# Patient Record
Sex: Female | Born: 2000 | Race: Black or African American | Hispanic: No | Marital: Single | Smoking: Never smoker
Health system: Southern US, Community
[De-identification: ages and names within clinical notes are randomized; demographics above are authoritative.]

## PROBLEM LIST (undated history)

## (undated) DIAGNOSIS — J45909 Unspecified asthma, uncomplicated: Secondary | ICD-10-CM

## (undated) HISTORY — PX: KNEE SURGERY: SHX244

---
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
## 2021-01-04 ENCOUNTER — Encounter (HOSPITAL_COMMUNITY): Payer: Self-pay

## 2021-01-04 ENCOUNTER — Ambulatory Visit (HOSPITAL_COMMUNITY)
Admission: EM | Admit: 2021-01-04 | Discharge: 2021-01-04 | Disposition: A | Discharge status: Home / Self Care | Payer: BC Managed Care – PPO | Attending: Emergency Medicine | Admitting: Emergency Medicine

## 2021-01-04 ENCOUNTER — Other Ambulatory Visit: Payer: Self-pay

## 2021-01-04 DIAGNOSIS — J45909 Unspecified asthma, uncomplicated: Secondary | ICD-10-CM | POA: Diagnosis not present

## 2021-01-04 DIAGNOSIS — H66001 Acute suppurative otitis media without spontaneous rupture of ear drum, right ear: Secondary | ICD-10-CM | POA: Diagnosis present

## 2021-01-04 DIAGNOSIS — J069 Acute upper respiratory infection, unspecified: Secondary | ICD-10-CM | POA: Diagnosis not present

## 2021-01-04 LAB — POCT RAPID STREP A, ED / UC: Streptococcus, Group A Screen (Direct): NEGATIVE

## 2021-01-04 LAB — POC INFLUENZA A AND B ANTIGEN (URGENT CARE ONLY)
INFLUENZA A ANTIGEN, POC: NEGATIVE
INFLUENZA B ANTIGEN, POC: NEGATIVE

## 2021-01-04 MED ORDER — BUDESONIDE-FORMOTEROL FUMARATE 80-4.5 MCG/ACT IN AERO: 2.0000 | INHALATION_SPRAY | Freq: Two times a day (BID) | RESPIRATORY_TRACT | 2 refills | Status: AC

## 2021-01-04 MED ORDER — ALBUTEROL SULFATE HFA 108 (90 BASE) MCG/ACT IN AERS: 2.0000 | INHALATION_SPRAY | Freq: Four times a day (QID) | RESPIRATORY_TRACT | 2 refills | Status: AC | PRN

## 2021-01-04 MED ORDER — AEROCHAMBER PLUS FLO-VU MEDIUM MISC: 1.0000 | Freq: Once | 0 refills | Status: AC

## 2021-01-04 MED ORDER — CEFDINIR 300 MG PO CAPS: 300.0000 mg | ORAL_CAPSULE | Freq: Two times a day (BID) | ORAL | 0 refills | Status: AC

## 2021-01-04 NOTE — ED Triage Notes (Signed)
Pt presents with a cough and sore throat. States she has a stuffy nose. States she has asthma and has had a flare up. States she took Mucinex.

## 2021-01-04 NOTE — Discharge Instructions (Addendum)
For the ear infection in your right ear, have given you prescription for cefdinir, please take 1 tablet twice daily for the next 7 days.  I have renewed your prescription for albuterol and provided you with a spacer.  Would also like for you to begin daily maintenance inhaler called Symbicort, 2 puffs twice daily for the next 30 days.  You will be notified of the results of your flu and strep tests once they are complete.  As soon as possible, please follow-up with your primary care physician to discuss your asthma and strategies to prevent flareups because of respiratory infections are inevitable.  Thank you for visiting urgent care today, I hope you feel better soon.

## 2021-01-04 NOTE — ED Provider Notes (Signed)
[facility]    CSN: [id number] Arrival date & time: 01/04/21  1833      History   Chief Complaint Chief Complaint  Patient presents with   Cough   Sore Throat    HPI Kelly Duke is a 20 y.o. female.   Pt presents with a cough and sore throat. States she has a stuffy nose.  Patient states she is not sure whether or not she is had a fever.  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 States she took Mucinex and has been using Vicks vapor rub with little relief.  Patient's vital signs are normal on arrival today she is saturating 100%.  Patient is also nontoxic in appearance, smiling, pleasant and interactive.  The history is provided by the patient.   Past Medical History:  Diagnosis Date   Asthma     There are no problems to display for this patient.   Past Surgical History:  Procedure Laterality Date   KNEE SURGERY Left     OB History   No obstetric history on file.      Home Medications    Prior to Admission medications   Medication Sig Start Date End Date Taking? Authorizing Provider  albuterol (VENTOLIN HFA) 108 (90 Base) MCG/ACT inhaler Inhale 2 puffs into the lungs every 6 (six) hours as needed for wheezing or shortness of breath (Cough). 01/04/21  Yes Theadora Rama Scales, PA-C  budesonide-formoterol (SYMBICORT) 80-4.5 MCG/ACT inhaler Inhale 2 puffs into the lungs 2 (two) times daily. 01/04/21 02/03/21 Yes Theadora Rama Scales, PA-C  cefdinir (OMNICEF) 300 MG capsule Take 1 capsule (300 mg total) by mouth 2 (two) times daily for 7 days. 01/04/21 01/11/21 Yes Theadora Rama Scales, PA-C  Spacer/Aero-Holding Chambers (AEROCHAMBER PLUS FLO-VU MEDIUM) MISC 1 each by Other route once for 1 dose. 01/04/21 01/04/21 Yes Theadora Rama Scales, PA-C    Family  History Family History  Problem Relation Age of Onset   Heart disease Father    Hyperlipidemia Father    Cancer Maternal Grandmother    Hypertension Maternal Grandmother    Cancer Maternal Grandfather    Diabetes Maternal Grandfather    Cancer Paternal Grandmother    Hyperlipidemia Paternal Grandmother     Social History Social History   Tobacco Use   Smoking status: Never   Smokeless tobacco: Never     Allergies   Neosporin [neomycin-bacitracin zn-polymyx]   Review of Systems Review of Systems Pertinent findings noted in history of present illness.    Physical Exam Triage Vital Signs ED Triage Vitals  Enc Vitals Group     BP 12/29/20 0827 (!) 147/82     Pulse Rate 12/29/20 0827 72     Resp 12/29/20 0827 18     Temp 12/29/20 0827 98.3 F (36.8 C)     Temp Source 12/29/20 0827 Oral     SpO2 12/29/20 0827 98 %     Weight --      Height --      Head Circumference --      Peak Flow --      Pain Score 12/29/20 0826 5     Pain Loc --      Pain Edu? --      Excl. in GC? --    No data found.  Updated Vital Signs BP 113/72 (BP Location: Right Arm)   Pulse 78   Temp 98.9 F (37.2 C) (Oral)   Resp 20   SpO2 100%   Visual Acuity Right Eye Distance:   Left Eye Distance:   Bilateral Distance:    Right Eye Near:   Left Eye Near:    Bilateral Near:     Physical Exam Vitals and nursing note reviewed.  Constitutional:      General: She is not in acute distress.    Appearance: Normal appearance. She is not ill-appearing.  HENT:     Head: Normocephalic and atraumatic.     Salivary Glands: Right salivary gland is diffusely enlarged and tender. Left salivary gland is diffusely enlarged and tender.     Right Ear: Ear canal and external ear normal. No drainage. A middle ear effusion is present. There is no impacted cerumen. Tympanic membrane is erythematous and bulging.     Left Ear: Tympanic membrane, ear canal and external ear normal. No drainage.  No middle  ear effusion. There is no impacted cerumen. Tympanic membrane is not erythematous or bulging.     Nose: No nasal deformity, septal deviation, mucosal edema, congestion or rhinorrhea.     Right Turbinates: Enlarged, swollen and pale.     Left Turbinates: Enlarged, swollen and pale.     Right Sinus: No maxillary sinus tenderness or frontal sinus tenderness.     Left Sinus: No maxillary sinus tenderness or frontal sinus tenderness.     Mouth/Throat:     Lips: Pink. No lesions.     Mouth: Mucous membranes are moist. No oral lesions.     Pharynx: Uvula midline. Pharyngeal swelling, posterior oropharyngeal erythema and uvula swelling present.     Tonsils: Tonsillar exudate present. 3+ on the right. 3+ on the left.  Eyes:     General: Lids are normal.        Right eye: No discharge.        Left eye: No discharge.     Extraocular Movements: Extraocular movements intact.     Conjunctiva/sclera: Conjunctivae normal.     Right eye: Right conjunctiva is not injected.     Left eye: Left conjunctiva is not injected.  Neck:     Trachea: Trachea and phonation normal.  Cardiovascular:     Rate and Rhythm: Normal rate and regular rhythm.     Pulses: Normal pulses.     Heart sounds: Normal heart sounds. No murmur heard.   No friction rub. No gallop.  Pulmonary:     Effort: Pulmonary effort is normal. No accessory muscle usage, prolonged expiration or respiratory distress.     Breath sounds: Normal breath sounds. No stridor, decreased air movement or transmitted upper airway sounds. No decreased breath sounds, wheezing, rhonchi or rales.  Chest:     Chest wall: No tenderness.  Musculoskeletal:        General: Normal range of motion.     Cervical back: Normal range of motion and neck supple. Normal range of motion.  Lymphadenopathy:     Cervical: No cervical adenopathy.  Skin:    General: Skin is warm and dry.     Findings: No erythema or rash.  Neurological:     General: No focal deficit present.      Mental Status: She is alert and oriented to person, place, and time.  Psychiatric:        Mood and Affect: Mood normal.  Behavior: Behavior normal.     UC Treatments / Results  Labs (all labs ordered are listed, but only abnormal results are displayed) Labs Reviewed  CULTURE, GROUP A STREP [facility])  POCT RAPID STREP A, ED / UC  POC INFLUENZA A AND B ANTIGEN (URGENT CARE ONLY)    EKG   Radiology No results found.  Procedures Procedures (including critical care time)  Medications Ordered in UC Medications - No data to display  Initial Impression / Assessment and Plan / UC Course  I have reviewed the triage vital signs and the nursing notes.  Pertinent labs & imaging results that were available during my care of the patient were reviewed by me and considered in my medical decision making (see chart for details).     Physical exam findings are concerning for enlarged tonsils with exudate and erythema, suppurative effusion behind right TM with erythema.  I will treat patient with cefdinir for acute otitis media, rapid flu and strep test were performed today, throat culture will be sent per protocol.  Patient verbalized understanding and agreement of plan as discussed.  All questions were addressed during visit.  Please see discharge instructions below for further details of plan.  Final Clinical Impressions(s) / UC Diagnoses   Final diagnoses:  Upper respiratory tract infection, unspecified type  Acute suppurative otitis media of right ear  Asthma, unspecified asthma severity, unspecified whether complicated, unspecified whether persistent     Discharge Instructions      For the ear infection in your right ear, have given you prescription for cefdinir, please take 1 tablet twice daily for the next 7 days.  I have renewed your prescription for albuterol and provided you with a spacer.  Would also like for you to begin daily maintenance inhaler called Symbicort, 2  puffs twice daily for the next 30 days.  You will be notified of the results of your flu and strep tests once they are complete.  As soon as possible, please follow-up with your primary care physician to discuss your asthma and strategies to prevent flareups because of respiratory infections are inevitable.  Thank you for visiting urgent care today, I hope you feel better soon.     ED Prescriptions     Medication Sig Dispense Auth. Provider   budesonide-formoterol (SYMBICORT) 80-4.5 MCG/ACT inhaler Inhale 2 puffs into the lungs 2 (two) times daily. 1 each Theadora Rama Scales, PA-C   albuterol (VENTOLIN HFA) 108 (90 Base) MCG/ACT inhaler Inhale 2 puffs into the lungs every 6 (six) hours as needed for wheezing or shortness of breath (Cough). 18 g Theadora Rama Scales, PA-C   Spacer/Aero-Holding Chambers (AEROCHAMBER PLUS FLO-VU MEDIUM) MISC 1 each by Other route once for 1 dose. 1 each Theadora Rama Scales, PA-C   cefdinir (OMNICEF) 300 MG capsule Take 1 capsule (300 mg total) by mouth 2 (two) times daily for 7 days. 14 capsule Theadora Rama Scales, PA-C      PDMP not reviewed this encounter.    Theadora Rama Scales, PA-C 01/04/21 2057

## 2021-01-07 LAB — CULTURE, GROUP A STREP (THRC)

## 2021-06-05 DIAGNOSIS — A549 Gonococcal infection, unspecified: Secondary | ICD-10-CM | POA: Insufficient documentation

## 2021-08-06 DIAGNOSIS — A749 Chlamydial infection, unspecified: Secondary | ICD-10-CM | POA: Insufficient documentation

## 2023-06-10 ENCOUNTER — Encounter: Payer: Self-pay | Admitting: Dermatology

## 2023-06-10 ENCOUNTER — Ambulatory Visit: Payer: BC Managed Care – PPO | Admitting: Dermatology

## 2023-06-10 VITALS — BP 125/82 | HR 98

## 2023-06-10 DIAGNOSIS — L810 Postinflammatory hyperpigmentation: Secondary | ICD-10-CM

## 2023-06-10 DIAGNOSIS — L700 Acne vulgaris: Secondary | ICD-10-CM | POA: Diagnosis not present

## 2023-06-10 DIAGNOSIS — L709 Acne, unspecified: Secondary | ICD-10-CM

## 2023-06-10 MED ORDER — ALTRENO 0.05 % EX LOTN: 1.0000 | TOPICAL_LOTION | Freq: Every evening | CUTANEOUS | 5 refills | Status: AC

## 2023-06-10 MED ORDER — CLINDAMYCIN PHOSPHATE 1 % EX SWAB: 1.0000 | Freq: Every morning | CUTANEOUS | 6 refills | Status: AC

## 2023-06-10 NOTE — Patient Instructions (Addendum)
 Hello Joeli,  Thank you for visiting today. Here is a summary of the key instructions:  - Medications:   - Use clindamycin swabs in the morning   - Apply Altreno (tretinoin) at night:     - Start with once a week for the first week     - Advance to twice a week for 2-3 weeks     - Maximum use: 3 nights a week   - If irritation occurs, stop Altreno for a week, then restart less frequently  - Skincare Routine:   Morning:   - Wash face with La Roche-Posay salicylic acid cleanser   - Apply clindamycin swabs   - Apply Vichy hyaluronic acid Serum (photo below)   - Apply moisturizer with sunscreen    Night:   - Wash face with La Roche-Posay Gentle Cleanser   - Apply Vichy hyaluronic acid   - Apply a pea-sized amount of Altreno (on designated nights)   - Apply Eucerin Radiant Tone Serum (purchase at [employer])  - Products:   - Use Vichy hyaluronic acid as a base before Altreno   - Consider using Eucerin Radiant Tone with thiametol as a moisturizer for dark spots   - Use CeraVe daily moisturizer lotion during the day   - Switch to CeraVe cream at night if skin feels dry  - Instructions:   - If dryness or stinging occurs, stop using salicylic acid cleanser   - Wear moisturizer with sunscreen in the mornings to prevent dark spots from darkening   - Sign up for [employer] to receive Altreno (expected cost: $47-$52)  - Follow-up: Return for a follow-up appointment in 4 months  Please reach out if you have any questions or concerns.  Warm regards,  Dr. Langston Reusing, Dermatology           Important Information   Due to recent changes in healthcare laws, you may see results of your pathology and/or laboratory studies on MyChart before the doctors have had a chance to review them. We understand that in some cases there may be results that are confusing or concerning to you. Please understand that not all results are received at the same time and often the doctors may need to  interpret multiple results in order to provide you with the best plan of care or course of treatment. Therefore, we ask that you please give [place] 2 business days to thoroughly review all your results before contacting the office for clarification. Should we see a critical lab result, you will be contacted sooner.     If You Need Anything After Your Visit   If you have any questions or concerns for your doctor, please call our main line at [phone]. If no one answers, please leave a voicemail as directed and we will return your call as soon as possible. Messages left after 4 pm will be answered the following business day.    You may also send [place] a message via MyChart. We typically respond to MyChart messages within 1-2 business days.  For prescription refills, please ask your pharmacy to contact our office. Our fax number is [phone].  If you have an urgent issue when the clinic is closed that cannot wait until the next business day, you can page your doctor at the number below.     Please note that while we do our best to be available for urgent issues outside of office hours, we are not available 24/7.    If you  have an urgent issue and are unable to reach [place], you may choose to seek medical care at your doctor's office, retail clinic, urgent care center, or emergency room.   If you have a medical emergency, please immediately call 911 or go to the emergency department. In the event of inclement weather, please call our main line at [phone] for an update on the status of any delays or closures.  Dermatology Medication Tips: Please keep the boxes that topical medications come in in order to help keep track of the instructions about where and how to use these. Pharmacies typically print the medication instructions only on the boxes and not directly on the medication tubes.   If your medication is too expensive, please contact our office at [phone] or send [place] a message through  MyChart.    We are unable to tell what your co-pay for medications will be in advance as this is different depending on your insurance coverage. However, we may be able to find a substitute medication at lower cost or fill out paperwork to get insurance to cover a needed medication.    If a prior authorization is required to get your medication covered by your insurance company, please allow [place] 1-2 business days to complete this process.   Drug prices often vary depending on where the prescription is filled and some pharmacies may offer cheaper prices.   The website www.goodrx.com contains coupons for medications through different pharmacies. The prices here do not account for what the cost may be with help from insurance (it may be cheaper with your insurance), but the website can give you the price if you did not use any insurance.  - You can print the associated coupon and take it with your prescription to the pharmacy.  - You may also stop by our office during regular business hours and pick up a GoodRx coupon card.  - If you need your prescription sent electronically to a different pharmacy, notify our office through [facility] or by phone at [phone]

## 2023-06-10 NOTE — Progress Notes (Signed)
   New Patient Visit   Subjective  Kelly Duke is a 23 y.o. female who presents for the following: Acne  Patient states she has acne located at the face that she would like to have examined. Patient reports the areas have been there for 12 years. She reports the areas are bothersome. Patient reports her acne can be painful. Patient rates irritation 3 out of 10. She states that the areas have spread. Patient reports she has previously been treated for these areas. Patient reports her facial regimen consist of washing 2 times daily with LRP SA Wash (Double cleansing at night), She follows uo with Vitamin C Serum 9Vit C, Hyluronic Acid and Vit E), 2 times daily she will spot treat with LRP Effaclar BPO treatment and Cerave Daily Moisturizing Cream and LRP Anthelios Suncreen. She has also completed laser treatment last treatment was in February. Patient reports she has tried and failed Tretinoin 0.025% and it caused excessive burning and peeling. She states she only used it every other night.  The following portions of the chart were reviewed this encounter and updated as appropriate: medications, allergies, medical history  Review of Systems:  No other skin or systemic complaints except as noted in HPI or Assessment and Plan.  Objective  Well appearing patient in no apparent distress; mood and affect are within normal limits.  A focused examination was performed of the following areas: Face  Relevant exam findings are noted in the Assessment and Plan.            Assessment & Plan   ACNE VULGARIS and PIH Exam: Open comedones and inflammatory papules w/ secondary brown macules  Flared  - Assessment: Patient presents with persistent acne for approximately two years, with flares associated with menstrual cycle. Previous treatments include over-the-counter products and tretinoin (lowest strength), which caused skin irritation. Chronicity, hormonal component, and inadequate response to  OTC treatments warrant prescription management.  Treatment Plan: - We will prescribe Clindamycin swabs to apply directly to the face after washing - Recommend continuing LRP SA Wash as long as her face tolerates and isn't having excessive dryness. Advised if she is experiencing excessive dryness to D'c the LRP SA wash and switch to LRP Hydrating Facial wash - We will plan to prescribe Altreno 0.05% to apply 1 night for the first week, after 1 week increase to 3 nights weekly M,W, F - Recommended continuing Cerave Moisturizer, but recommended trying the lotion instead of the cream due to weather - Advised if she is still having deeper breakouts at the follow up visit in 4 months, we will discuss trying a oral medication to control  - We will plan to follow in 4 months to reassess  ACNE, UNSPECIFIED ACNE TYPE   Related Medications ALTRENO 0.05 % LOTN Apply 1 Application topically at bedtime. For the first week apply 1 night weekly, after first week apply 2 nights (Monday and Thursday) weekly, After 3rd week increase to 3 nights weekly (Monday, Wednesday and Friday) clindamycin (CLEOCIN T) 1 % SWAB Apply 1 Application topically in the morning. Swab face in the am after washing  Return in about 4 months (around 10/10/2023) for Acne F/U.  I, Jetta Ager, am acting as Neurosurgeon for [employer], DO.  Documentation: I have reviewed the above documentation for accuracy and completeness, and I agree with the above.  Louana Roup, DO

## 2023-06-17 ENCOUNTER — Encounter: Payer: Self-pay | Admitting: Dermatology

## 2023-06-20 ENCOUNTER — Encounter: Payer: Self-pay | Admitting: Dermatology

## 2023-08-30 ENCOUNTER — Emergency Department (HOSPITAL_COMMUNITY)
Admission: EM | Admit: 2023-08-30 | Discharge: 2023-08-31 | Discharge status: Against Medical Advice | Attending: Emergency Medicine | Admitting: Emergency Medicine

## 2023-08-30 ENCOUNTER — Encounter (HOSPITAL_COMMUNITY): Payer: Self-pay | Admitting: *Deleted

## 2023-08-30 ENCOUNTER — Other Ambulatory Visit: Payer: Self-pay

## 2023-08-30 DIAGNOSIS — S61411A Laceration without foreign body of right hand, initial encounter: Secondary | ICD-10-CM | POA: Diagnosis present

## 2023-08-30 DIAGNOSIS — S61214A Laceration without foreign body of right ring finger without damage to nail, initial encounter: Secondary | ICD-10-CM | POA: Diagnosis not present

## 2023-08-30 DIAGNOSIS — W25XXXA Contact with sharp glass, initial encounter: Secondary | ICD-10-CM | POA: Insufficient documentation

## 2023-08-30 DIAGNOSIS — S61412A Laceration without foreign body of left hand, initial encounter: Secondary | ICD-10-CM | POA: Diagnosis not present

## 2023-08-30 DIAGNOSIS — Z5321 Procedure and treatment not carried out due to patient leaving prior to being seen by health care provider: Secondary | ICD-10-CM | POA: Diagnosis not present

## 2023-08-30 NOTE — ED Triage Notes (Signed)
 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

## 2023-08-31 ENCOUNTER — Emergency Department (HOSPITAL_COMMUNITY)

## 2023-08-31 ENCOUNTER — Emergency Department (HOSPITAL_COMMUNITY)
Admission: EM | Admit: 2023-08-31 | Discharge: 2023-08-31 | Disposition: A | Discharge status: Home / Self Care | Payer: Worker's Compensation | Attending: Emergency Medicine | Admitting: Emergency Medicine

## 2023-08-31 ENCOUNTER — Encounter (HOSPITAL_COMMUNITY): Payer: Self-pay | Admitting: Emergency Medicine

## 2023-08-31 DIAGNOSIS — S61214A Laceration without foreign body of right ring finger without damage to nail, initial encounter: Secondary | ICD-10-CM | POA: Diagnosis present

## 2023-08-31 DIAGNOSIS — W25XXXA Contact with sharp glass, initial encounter: Secondary | ICD-10-CM | POA: Diagnosis not present

## 2023-08-31 DIAGNOSIS — Z23 Encounter for immunization: Secondary | ICD-10-CM | POA: Diagnosis not present

## 2023-08-31 DIAGNOSIS — S61212A Laceration without foreign body of right middle finger without damage to nail, initial encounter: Secondary | ICD-10-CM | POA: Diagnosis not present

## 2023-08-31 DIAGNOSIS — Y990 Civilian activity done for income or pay: Secondary | ICD-10-CM | POA: Insufficient documentation

## 2023-08-31 MED ORDER — LIDOCAINE-EPINEPHRINE (PF) 2 %-1:200000 IJ SOLN
INTRAMUSCULAR | Status: AC
  Filled 2023-08-31: qty 20

## 2023-08-31 MED ORDER — TETANUS-DIPHTH-ACELL PERTUSSIS 5-2.5-18.5 LF-MCG/0.5 IM SUSY
0.5000 mL | PREFILLED_SYRINGE | Freq: Once | INTRAMUSCULAR | Status: AC
  Administered 2023-08-31: 0.5 mL via INTRAMUSCULAR
  Filled 2023-08-31: qty 0.5

## 2023-08-31 NOTE — Discharge Instructions (Addendum)
 You are seen in the ER today for your lacerations.  The large laceration of the base of your ring finger was repaired with a single suture which will need to be removed at the urgent care/ER/your primary care doctor in 7 to 10 days.  Your other laceration on your middle finger was repaired with skin glue which will fall off on its own in a couple of weeks.  Please keep your skin clean and dry, place a clean dressing on it daily, you may use antibiotic ointment over-the-counter.  Return to the ER sooner if you develop any redness, swelling, puslike drainage from your wounds, or any other new severe symptoms.

## 2023-08-31 NOTE — ED Notes (Signed)
 Pt left AMA

## 2023-08-31 NOTE — ED Triage Notes (Signed)
 Pt cut R middle finger on glass at work. Palmar surface. Bleeding controlled. Not updated on tetanus.

## 2023-08-31 NOTE — ED Notes (Signed)
 Wrapped finger/hand with non adhesive dressing and wrap to cover lacerations.

## 2023-08-31 NOTE — ED Provider Notes (Signed)
 [place] [facility] Note   CSN: [id number] Arrival date & time: 08/31/23  0036     Patient presents with: Extremity Laceration   Kelly Duke is a 23 y.o. 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 It broke on the counter, and she caught the broken glass on the way to the floor causing a few small lacerations to the volar aspect of both hands. Unknown last tetanus.    HPI     Prior to Admission medications   Medication Sig Start Date End Date Taking? Authorizing Provider  albuterol  (VENTOLIN  HFA) 108 (90 Base) MCG/ACT inhaler Inhale 2 puffs into the lungs every 6 (six) hours as needed for wheezing or shortness of breath (Cough). 01/04/21   Joesph Shaver Scales, PA-C  ALTRENO  0.05 % LOTN Apply 1 Application topically at bedtime. For the first week apply 1 night weekly, after first week apply 2 nights (Monday and Thursday) weekly, After 3rd week increase to 3 nights weekly (Monday, Wednesday and Friday) 06/10/23   Alm Delon SAILOR, DO  budesonide -formoterol  (SYMBICORT ) 80-4.5 MCG/ACT inhaler Inhale 2 puffs into the lungs 2 (two) times daily. 01/04/21 06/10/23  Joesph Shaver Scales, PA-C  cetirizine-pseudoephedrine (ZYRTEC-D) 5-120 MG tablet     [provider]  clindamycin  (CLEOCIN  T) 1 % SWAB Apply 1 Application topically in the morning. Swab face in the am after washing 06/10/23   Alm Delon SAILOR, DO  hydrOXYzine (ATARAX) 25 MG tablet Take 25 mg by mouth at bedtime as needed.    [provider]  lamoTRIgine (LAMICTAL) 150 MG tablet TAKE 1/2 TABLET(S) BY MOUTH DAILY FOR MOOD STABILITY    [provider]  methylphenidate 54 MG PO CR tablet Take 54 mg by mouth every morning. 05/19/23   [provider]  sertraline (ZOLOFT) 50 MG tablet TAKE 1 TABLET(S) BY MOUTH DAILY FOR ANXIETY/DEPRESSION 09/05/20   [provider]  XULANE 150-35 MCG/24HR transdermal patch Apply 1 patch every  week by transdermal route for 90 days. 05/27/23   [provider]    Allergies: Neosporin [neomycin-bacitracin zn-polymyx]    Review of Systems  Skin:  Positive for wound.    Updated Vital Signs BP 136/86 (BP Location: Left Arm)   Pulse 88   Temp 98.6 F (37 C) (Oral)   Resp 14   LMP 08/29/2023   SpO2 99%   Physical Exam Vitals and nursing note reviewed.  HENT:     Head: Normocephalic and atraumatic.   Eyes:     General: No scleral icterus.       Right eye: No discharge.        Left eye: No discharge.     Conjunctiva/sclera: Conjunctivae normal.   Pulmonary:     Effort: Pulmonary effort is normal.   Musculoskeletal:       Hands:   Skin:    General: Skin is warm and dry.   Neurological:     General: No focal deficit present.     Mental Status: She is alert.   Psychiatric:        Mood and Affect: Mood normal.     (all labs ordered are listed, but only abnormal results are displayed) Labs Reviewed - No data to display  EKG: None  Radiology: DG Hand 2 View Right Result Date: 08/31/2023 CLINICAL DATA:  Third and fourth right finger lacerations. EXAM: RIGHT HAND - 2 VIEW COMPARISON:  June 18, 2016 FINDINGS: There is no  evidence of fracture or dislocation. There is no evidence of arthropathy or other focal bone abnormality. Soft tissues are unremarkable. IMPRESSION: Negative. Electronically Signed   By: Suzen Dials M.D.   On: 08/31/2023 02:38     .Laceration Repair  Date/Time: 08/31/2023 4:00 AM  Performed by: Bobette Kelly SAUNDERS, PA-C Authorized by: Bobette Kelly SAUNDERS, PA-C   Consent:    Consent obtained:  Verbal   Consent given by:  Patient   Risks discussed:  Infection, pain and need for additional repair   Alternatives discussed:  No treatment and observation Universal protocol:    Patient identity confirmed:  Verbally with patient Anesthesia:    Anesthesia method:  Local infiltration   Local anesthetic:  Lidocaine  2% WITH epi  (subcut) Laceration details:    Location:  Finger   Finger location:  R ring finger   Length (cm):  1.5 (L shaped) Pre-procedure details:    Preparation:  Patient was prepped and draped in usual sterile fashion and imaging obtained to evaluate for foreign bodies Exploration:    Hemostasis achieved with:  Epinephrine   Imaging obtained: x-ray     Imaging outcome: foreign body not noted     Wound exploration: wound explored through full range of motion and entire depth of wound visualized     Wound extent: no foreign body, no signs of injury, no tendon damage, no underlying fracture and no vascular damage     Contaminated: no   Treatment:    Area cleansed with:  Saline   Amount of cleaning:  Standard   Irrigation solution:  Sterile saline Skin repair:    Repair method:  Sutures   Suture size:  4-0   Suture material:  Prolene   Suture technique:  Simple interrupted   Number of sutures:  1 Approximation:    Approximation:  Close Repair type:    Repair type:  Simple Post-procedure details:    Dressing:  Antibiotic ointment   Procedure completion:  Tolerated well, no immediate complications .Laceration Repair  Date/Time: 08/31/2023 4:01 AM  Performed by: Bobette Kelly SAUNDERS, PA-C Authorized by: Bobette Kelly SAUNDERS, PA-C   Consent:    Risks discussed:  Infection, pain and poor cosmetic result   Alternatives discussed:  No treatment Universal protocol:    Patient identity confirmed:  Verbally with patient Anesthesia:    Anesthesia method:  None Laceration details:    Location:  Finger   Finger location:  R long finger   Length (cm):  0.5 Pre-procedure details:    Preparation:  Imaging obtained to evaluate for foreign bodies Exploration:    Hemostasis achieved with:  Direct pressure   Imaging obtained: x-ray     Imaging outcome: foreign body not noted     Contaminated: no   Treatment:    Amount of cleaning:  Standard   Irrigation solution:  Sterile saline Skin  repair:    Repair method:  Tissue adhesive Approximation:    Approximation:  Close Repair type:    Repair type:  Simple Post-procedure details:    Dressing:  Open (no dressing)    Medications Ordered in the ED  Tdap (BOOSTRIX) injection 0.5 mL (0.5 mLs Intramuscular Given 08/31/23 0245)  lidocaine -EPINEPHrine (XYLOCAINE  W/EPI) 2 %-1:200000 (PF) injection (  Given by Other 08/31/23 0346)                                    Medical  Decision Making 23 y/o female with lac to the hand.   Vs normal, physical exam as above with small lacerations.  No concern for tendinous injury.  Full active range of motion of all digits of the right hand.  Normal capillary refill in all digits, normal radial pulses bilaterally.  Amount and/or Complexity of Data Reviewed Radiology: ordered.    Details: Negative for foreign body or osseous injury.  Risk Prescription drug management.   Wounds repaired as above, tetanus updated. Patient tolerated the procedure well.  Clinical concern for emergent underlying injury that warrant further ED workup or inpatient management is exceedingly low.  Talor  voiced understanding of her medical evaluation and treatment plan. Each of their questions answered to their expressed satisfaction.  Return precautions were given.  Patient is well-appearing, stable, and was discharged in good condition.  This chart was dictated using voice recognition software, Dragon. Despite the best efforts of this provider to proofread and correct errors, errors may still occur which can change documentation meaning.      Final diagnoses:  Laceration of right ring finger without foreign body without damage to nail, initial encounter    ED Discharge Orders     None          Bobette Kelly JONELLE DEVONNA 08/31/23 0404    Palumbo, April, MD 08/31/23 9481

## 2023-09-03 ENCOUNTER — Emergency Department (HOSPITAL_COMMUNITY)
Admission: EM | Admit: 2023-09-03 | Discharge: 2023-09-03 | Disposition: A | Discharge status: Home / Self Care | Payer: Worker's Compensation | Attending: Emergency Medicine | Admitting: Emergency Medicine

## 2023-09-03 ENCOUNTER — Other Ambulatory Visit: Payer: Self-pay

## 2023-09-03 ENCOUNTER — Encounter (HOSPITAL_COMMUNITY): Payer: Self-pay

## 2023-09-03 DIAGNOSIS — S61214D Laceration without foreign body of right ring finger without damage to nail, subsequent encounter: Secondary | ICD-10-CM | POA: Diagnosis not present

## 2023-09-03 DIAGNOSIS — W25XXXD Contact with sharp glass, subsequent encounter: Secondary | ICD-10-CM | POA: Diagnosis not present

## 2023-09-03 DIAGNOSIS — S6991XD Unspecified injury of right wrist, hand and finger(s), subsequent encounter: Secondary | ICD-10-CM | POA: Diagnosis present

## 2023-09-03 MED ORDER — CEPHALEXIN 500 MG PO CAPS: 500.0000 mg | ORAL_CAPSULE | Freq: Four times a day (QID) | ORAL | 0 refills | Status: AC

## 2023-09-03 NOTE — Discharge Instructions (Addendum)
 You are seen in the emergency department for concerns of possible infection from your recently repaired lacerations.  Given the area of pain and the drainage that you are experiencing, I have started you on a course of antibiotics.  Please take this as prescribed.  For any concerns of worsening infection or if you begin to experience fevers, return to the emergency department.  For home use, you can soak the lacerated areas with a mixture of warm water and antibacterial soap (Hibiclens, Dial) for 5 minutes two times daily or if they area becomes contaminated with dirt or other substances.

## 2023-09-03 NOTE — ED Provider Notes (Signed)
 [place] EMERGENCY DEPARTMENT AT [facility] Provider Note   CSN: [id number] Arrival date & time: 09/03/23  1301     Patient presents with: Hand Pain   Kelly Duke is a 23 y.o. female.  Patient presents the emergency department with concerns of hand pain and concern for infection.  She was seen 4 days ago for concerns of lacerations after catching a piece of glass.  She reports concerns for infection as she has had increasing pain, slight redness, and drainage from one of the repaired sites.  Denies any systemic symptoms.  Current on antibiotics.  Tdap updated during laceration repair on 08/31/2023.    Hand Pain       Prior to Admission medications   Medication Sig Start Date End Date Taking? Authorizing Provider  cephALEXin (KEFLEX) 500 MG capsule Take 1 capsule (500 mg total) by mouth 4 (four) times daily for 7 days. 09/03/23 09/10/23 Yes Dalaina Tates A, PA-C  albuterol  (VENTOLIN  HFA) 108 (90 Base) MCG/ACT inhaler Inhale 2 puffs into the lungs every 6 (six) hours as needed for wheezing or shortness of breath (Cough). 01/04/21   Joesph Shaver Scales, PA-C  ALTRENO  0.05 % LOTN Apply 1 Application topically at bedtime. For the first week apply 1 night weekly, after first week apply 2 nights (Monday and Thursday) weekly, After 3rd week increase to 3 nights weekly (Monday, Wednesday and Friday) 06/10/23   Alm Delon SAILOR, DO  budesonide -formoterol  (SYMBICORT ) 80-4.5 MCG/ACT inhaler Inhale 2 puffs into the lungs 2 (two) times daily. 01/04/21 06/10/23  Joesph Shaver Scales, PA-C  cetirizine-pseudoephedrine (ZYRTEC-D) 5-120 MG tablet     [provider]  clindamycin  (CLEOCIN  T) 1 % SWAB Apply 1 Application topically in the morning. Swab face in the am after washing 06/10/23   Alm Delon SAILOR, DO  hydrOXYzine (ATARAX) 25 MG tablet Take 25 mg by mouth at bedtime as needed.    [provider]  lamoTRIgine (LAMICTAL) 150 MG tablet TAKE 1/2 TABLET(S) BY MOUTH DAILY  FOR MOOD STABILITY    [provider]  methylphenidate 54 MG PO CR tablet Take 54 mg by mouth every morning. 05/19/23   [provider]  sertraline (ZOLOFT) 50 MG tablet TAKE 1 TABLET(S) BY MOUTH DAILY FOR ANXIETY/DEPRESSION 09/05/20   [provider]  XULANE 150-35 MCG/24HR transdermal patch Apply 1 patch every week by transdermal route for 90 days. 05/27/23   [provider]    Allergies: Neosporin [neomycin-bacitracin zn-polymyx]    Review of Systems  Skin:  Positive for wound.  All other systems reviewed and are negative.   Updated Vital Signs BP (!) 129/93 (BP Location: Left Arm)   Pulse 89   Temp 99.2 F (37.3 C) (Oral)   Resp 16   Wt 81.2 kg   LMP 08/29/2023   SpO2 98%   BMI 32.74 kg/m   Physical Exam Vitals and nursing note reviewed.  Constitutional:      General: She is not in acute distress.    Appearance: She is well-developed.  HENT:     Head: Normocephalic and atraumatic.  Eyes:     Conjunctiva/sclera: Conjunctivae normal.  Cardiovascular:     Rate and Rhythm: Normal rate and regular rhythm.     Heart sounds: No murmur heard. Pulmonary:     Effort: Pulmonary effort is normal. No respiratory distress.     Breath sounds: Normal breath sounds.  Abdominal:     Palpations: Abdomen is soft.     Tenderness: There  is no abdominal tenderness.  Musculoskeletal:        General: No swelling.     Cervical back: Neck supple.  Skin:    General: Skin is warm and dry.     Capillary Refill: Capillary refill takes less than 2 seconds.     Findings: Lesion present.         Comments: Rigth third digit lesion is well healing and well approximated. Appears to be showing early signs of infection with drainage present a some erythema beyond the wound margin.  Neurological:     Mental Status: She is alert.  Psychiatric:        Mood and Affect: Mood normal.     (all labs ordered are listed, but only abnormal results are displayed) Labs  Reviewed - No data to display  EKG: None  Radiology: No results found.   Procedures   Medications Ordered in the ED - No data to display                                  Medical Decision Making Risk Prescription drug management.   This patient presents to the ED for concern of hand pain.  Differential diagnosis includes tenosynovitis, cellulitis, retained foreign body   Problem List / ED Course:  Patient presents to the ED for wound evaluation. She was seen 4 days ago for initial visit in which sutures were placed after patient sustained lacerations to the hands from broken glass. Right third digit is primary area of concern with worsening pain and drainage from the wound. Right third digit with well healing wound but possible signs of infection. Serous drainage with slight erythema extending beyond the wound margin. Pain with flexion and extension. Some tenderness over palpation along the tendon sheath into the palm, but no signs of erythema or swelling in one isolated finger. Will start patient on antibiotic course for wound management. Return precautions discussed such as concerns for new or worsening symptoms. Discharged home in stable condition.  Final diagnoses:  Laceration of right ring finger without foreign body without damage to nail, subsequent encounter    ED Discharge Orders          Ordered    cephALEXin (KEFLEX) 500 MG capsule  4 times daily        09/03/23 1422               Leslie Jester A, PA-C 09/03/23 1431    Freddi Hamilton, MD 09/03/23 1456

## 2023-09-03 NOTE — ED Triage Notes (Signed)
 POV/ seen Sunday morning for right hand lac after being cut by glass/ sutures placed/ pt c/o worsening pain, and oozing from wound/ pt has bandage placed and has changed dressing x 3 since accident

## 2023-10-13 ENCOUNTER — Ambulatory Visit: Admitting: Dermatology

## 2023-10-13 ENCOUNTER — Encounter: Payer: Self-pay | Admitting: Dermatology

## 2023-10-13 VITALS — BP 110/77

## 2023-10-13 DIAGNOSIS — L709 Acne, unspecified: Secondary | ICD-10-CM

## 2023-10-13 DIAGNOSIS — L700 Acne vulgaris: Secondary | ICD-10-CM | POA: Diagnosis not present

## 2023-10-13 DIAGNOSIS — L810 Postinflammatory hyperpigmentation: Secondary | ICD-10-CM | POA: Diagnosis not present

## 2023-10-13 MED ORDER — SPIRONOLACTONE 100 MG PO TABS: 100.0000 mg | ORAL_TABLET | Freq: Every day | ORAL | 2 refills | Status: DC

## 2023-10-13 MED ORDER — SAFETY SEAL MISCELLANEOUS MISC: 0 refills | Status: AC

## 2023-10-13 NOTE — Progress Notes (Signed)
   Follow-Up Visit   Subjective  Kelly Duke is a 23 y.o. female established patient who presents for FOLLOW UP on the diagnoses listed below:  Patient was last evaluated on 06/10/23.   Acne: Prescribed Clindamycin  swabs (using QAM) & Altreno  0.05% - nights of MWF). Recommended CeraVe moisturizer LRP SA wash. Patient reports sxs are unchanged despite consistent use of prescription and OTC products.    The following portions of the chart were reviewed this encounter and updated as appropriate: medications, allergies, medical history  Review of Systems:  No other skin or systemic complaints except as noted in HPI or Assessment and Plan.  Objective  Well appearing patient in no apparent distress; mood and affect are within normal limits.   A focused examination was performed of the following areas: face   Relevant exam findings are noted in the Assessment and Plan.           Assessment & Plan   1. Acne Vulgaris - Assessment: Patient's skin condition has improved but is not yet clear since starting Altreno  in April. Current regimen includes Altreno , salicylic acid, and clindamycin  swabs. The acne appears to have a hormonal component, as evidenced by the decision to initiate spironolactone  therapy. - Plan:    Adjust Altreno  use to 5 nights a week, excluding weekends    Continue morning regimen: salicylic acid wash and clindamycin  swabs    Initiate spironolactone  1 tablet PO qhs with dinner    Counsel on potential side effects: lightheadedness, dizziness, menstrual cycle changes    Inform patient of 2-3 month timeframe to assess effectiveness    Recommend switching to Centella sunscreen for lighter, less greasy application    Consider Accutane as next treatment option if current regimen ineffective  2. Post-inflammatory Hyperpigmentation - Assessment: Patient presents with dark marks secondary to acne. Current treatment includes nighttime application of Eucerin with  thiamidol. - Plan:    Increase Eucerin with thiamidol  application to twice daily (morning and night)    Continue Altreno  5 nights a week to address dark marks    Modify morning skincare routine: wash face, apply clindamycin , mix hydroquinone and Eucerin Radiantone, apply to affected areas, spot-treat as needed, wait before applying sunscreen    Modify evening skincare routine: wash face, apply Altreno  5 nights per week, spot-treat with transaminic acid and Eucerin    Consider prescription lightening cream with transaminic acid if needed    Educate on importance of sunscreen reapplication  Follow-up in 3 months to assess treatment efficacy.   No follow-ups on file.   Documentation: I have reviewed the above documentation for accuracy and completeness, and I agree with the above.  I, Shirron Maranda, CMA, am acting as scribe for [employer], DO.   Delon Lenis, DO

## 2023-10-13 NOTE — Patient Instructions (Addendum)
 Date: Mon Oct 13 2023  Hello Therese,  Thank you for visiting today. Here is a summary of the key instructions:  - Medications:   - Use Altreno  5 nights a week, skipping weekends   - Apply clindamycin  swabs in the morning   - Start spironolactone  100mg  once a day at night with dinner   - Apply Tranexamic Acid topical lighten from [facility]  and Eucerin Radiant Tone twice a day (morning and night)  - Skin Care:   - Switch to Centella sunscreen (lighter and less greasy)   - Reapply sunscreen throughout the day   - Morning routine: wash face, apply clindamycin , mix hydroquinone and Eucerin Radiantone, spot-treat, wait, then apply sunscreen   - Night routine: wash face, apply Altreno  (5 nights a week, skip weekends), spot-treat with transaminic acid and Eucerin, use Vichy if skin is dry before clindamycin   - Follow-up:   - Return for follow-up appointment in 3 months  - Other Instructions:   - Watch for lightheadedness or dizziness from spironolactone    - Monitor for possible changes in menstrual cycle   - Message through MyChart for any questions  Please reach out if you have any questions or concerns.  Warm regards,  Dr. Delon Lenis [facility]     Important Information  Due to recent changes in healthcare laws, you may see results of your pathology and/or laboratory studies on MyChart before the doctors have had a chance to review them. We understand that in some cases there may be results that are confusing or concerning to you. Please understand that not all results are received at the same time and often the doctors may need to interpret multiple results in order to provide you with the best plan of care or course of treatment. Therefore, we ask that you please give us  2 business days to thoroughly review all your results before contacting the office for clarification. Should we see a critical lab result, you will be contacted sooner.   If You Need Anything After Your  Visit  If you have any questions or concerns for your doctor, please call our main line at [phone] If no one answers, please leave a voicemail as directed and we will return your call as soon as possible. Messages left after 4 pm will be answered the following business day.   You may also send us  a message via MyChart. We typically respond to MyChart messages within 1-2 business days.  For prescription refills, please ask your pharmacy to contact our office. Our fax number is [phone].  If you have an urgent issue when the clinic is closed that cannot wait until the next business day, you can page your doctor at the number below.    Please note that while we do our best to be available for urgent issues outside of office hours, we are not available 24/7.   If you have an urgent issue and are unable to reach us , you may choose to seek medical care at your doctor's office, retail clinic, urgent care center, or emergency room.  If you have a medical emergency, please immediately call 911 or go to the emergency department. In the event of inclement weather, please call our main line at [phone] for an update on the status of any delays or closures.  Dermatology Medication Tips: Please keep the boxes that topical medications come in in order to help keep track of the instructions about where and how to use these. Pharmacies typically print the  medication instructions only on the boxes and not directly on the medication tubes.   If your medication is too expensive, please contact our office at [phone] or send us  a message through MyChart.   We are unable to tell what your co-pay for medications will be in advance as this is different depending on your insurance coverage. However, we may be able to find a substitute medication at lower cost or fill out paperwork to get insurance to cover a needed medication.   If a prior authorization is required to get your medication covered by  your insurance company, please allow us  1-2 business days to complete this process.  Drug prices often vary depending on where the prescription is filled and some pharmacies may offer cheaper prices.  The website www.goodrx.com contains coupons for medications through different pharmacies. The prices here do not account for what the cost may be with help from insurance (it may be cheaper with your insurance), but the website can give you the price if you did not use any insurance.  - You can print the associated coupon and take it with your prescription to the pharmacy.  - You may also stop by our office during regular business hours and pick up a GoodRx coupon card.  - If you need your prescription sent electronically to a different pharmacy, notify our office through [facility] or by phone at [phone]

## 2024-01-09 ENCOUNTER — Other Ambulatory Visit: Payer: Self-pay | Admitting: Dermatology

## 2024-01-20 ENCOUNTER — Ambulatory Visit: Admitting: Dermatology

## 2024-01-20 VITALS — BP 102/72

## 2024-01-20 DIAGNOSIS — L709 Acne, unspecified: Secondary | ICD-10-CM

## 2024-01-20 DIAGNOSIS — L700 Acne vulgaris: Secondary | ICD-10-CM

## 2024-01-20 DIAGNOSIS — L811 Chloasma: Secondary | ICD-10-CM | POA: Diagnosis not present

## 2024-01-20 DIAGNOSIS — L853 Xerosis cutis: Secondary | ICD-10-CM

## 2024-01-20 MED ORDER — SPIRONOLACTONE 100 MG PO TABS: 150.0000 mg | ORAL_TABLET | Freq: Every day | ORAL | 11 refills | Status: DC

## 2024-01-20 NOTE — Patient Instructions (Addendum)
 VISIT SUMMARY:  Today, we reviewed your current acne treatment and made some adjustments to help improve your skin condition. We also discussed your melasma and dry skin around your eyes, providing additional recommendations to address these issues.  YOUR PLAN:  -ACNE VULGARIS:  Acne vulgaris is a common skin condition that occurs when hair follicles become clogged with oil and dead skin cells, leading to pimples, blackheads, and whiteheads.   We have increased your spironolactone  dose to 150 mg daily to help manage your acne, especially around your menstrual cycle.   Continue using Altreno  Monday through Friday, but reduce the frequency if you experience excessive dryness or irritation. Also, continue using clindamycin  during the day.  -HYPERPIGMENTATION:  Hyperpigmentation is a skin condition characterized by dark, discolored patches on the skin, often due to sun exposure and hormonal changes. Continue using your prescription tranexamic acid and sunscreen daily.   We have provided samples of Avene cicalfate cream for nighttime use to help repair your skin barrier and improve tolerance to tretinoin .    INSTRUCTIONS:  Please follow up in three months to review your progress and make any necessary adjustments to your treatment plan.   Important Information  Due to recent changes in healthcare laws, you may see results of your pathology and/or laboratory studies on MyChart before the doctors have had a chance to review them. We understand that in some cases there may be results that are confusing or concerning to you. Please understand that not all results are received at the same time and often the doctors may need to interpret multiple results in order to provide you with the best plan of care or course of treatment. Therefore, we ask that you please give us  2 business days to thoroughly review all your results before contacting the office for clarification. Should we see a critical lab  result, you will be contacted sooner.   If You Need Anything After Your Visit  If you have any questions or concerns for your doctor, please call our main line at [phone] If no one answers, please leave a voicemail as directed and we will return your call as soon as possible. Messages left after 4 pm will be answered the following business day.   You may also send us  a message via MyChart. We typically respond to MyChart messages within 1-2 business days.  For prescription refills, please ask your pharmacy to contact our office. Our fax number is [phone].  If you have an urgent issue when the clinic is closed that cannot wait until the next business day, you can page your doctor at the number below.    Please note that while we do our best to be available for urgent issues outside of office hours, we are not available 24/7.   If you have an urgent issue and are unable to reach us , you may choose to seek medical care at your doctor's office, retail clinic, urgent care center, or emergency room.  If you have a medical emergency, please immediately call 911 or go to the emergency department. In the event of inclement weather, please call our main line at [phone] for an update on the status of any delays or closures.  Dermatology Medication Tips: Please keep the boxes that topical medications come in in order to help keep track of the instructions about where and how to use these. Pharmacies typically print the medication instructions only on the boxes and not directly on the medication tubes.   If your  medication is too expensive, please contact our office at [phone] or send us  a message through MyChart.   We are unable to tell what your co-pay for medications will be in advance as this is different depending on your insurance coverage. However, we may be able to find a substitute medication at lower cost or fill out paperwork to get insurance to cover a needed medication.    If a prior authorization is required to get your medication covered by your insurance company, please allow us  1-2 business days to complete this process.  Drug prices often vary depending on where the prescription is filled and some pharmacies may offer cheaper prices.  The website www.goodrx.com contains coupons for medications through different pharmacies. The prices here do not account for what the cost may be with help from insurance (it may be cheaper with your insurance), but the website can give you the price if you did not use any insurance.  - You can print the associated coupon and take it with your prescription to the pharmacy.  - You may also stop by our office during regular business hours and pick up a GoodRx coupon card.  - If you need your prescription sent electronically to a different pharmacy, notify our office through [facility] or by phone at [phone]

## 2024-01-20 NOTE — Progress Notes (Signed)
   Follow-Up Visit   Subjective  Kelly Duke is a 23 y.o. female established patient who presents for FOLLOW UP on the diagnoses listed below:  Patient was last evaluated on 10/13/23.   Acne: Continued Altreno  M-F, Spirolactone 100mg  daily, clindamycin  swabs QAM, Centella SPF. Patient reports sxs are better - he has been having less break outs but during her menstrual she flares some.   PIH: She has been medrock melasmic cream & Eucerin RT serum. She stated that she has been using them consistently but doesn't feel that the pigmentation has improved much.    Are you nursing, pregnant or trying to conceive? No   The following portions of the chart were reviewed this encounter and updated as appropriate: medications, allergies, medical history  Review of Systems:  No other skin or systemic complaints except as noted in HPI or Assessment and Plan.  Objective  Well appearing patient in no apparent distress; mood and affect are within normal limits.   A focused examination was performed of the following areas: face   Relevant exam findings are noted in the Assessment and Plan.           Assessment & Plan    Acne vulgaris Breakthrough lesions primarily around menstrual cycles. Current treatment with spironolactone  100 mg has shown some improvement but not complete resolution. - Increased spironolactone  to 150 mg daily by taking one 100 mg tablet and one 50 mg tablet until the 100 mg tablets are finished, then take three 50 mg tablets daily. - Continue Altreno  Monday through Friday, with instructions to reduce frequency if excessive dryness or irritation occurs. - Continue clindamycin  during the day.  Melasma Improvement in dark spots but not fully resolved. Current treatment includes prescription tranexamic acid and sunscreen. The life cycle of melanocytes is three months, and results build over six to eight months with diligent use of sunscreen and lightening  creams. - Continue prescription tranexamic acid and sunscreen. - Provided samples of Avene cicalfate cream for nighttime use to aid in skin barrier repair and tolerance of tretinoin .  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 - Apply Aquaphor as a barrier around the eyes, especially in winter. - Use CeraVe daily moisturizer during the day. - Provided samples of Avene cicalfate cream for nighttime use.      No follow-ups on file.   Documentation: I have reviewed the above documentation for accuracy and completeness, and I agree with the above.  I, Shirron Maranda, CMA, am acting as scribe for [employer], DO.   Delon Lenis, DO

## 2024-02-01 ENCOUNTER — Encounter: Payer: Self-pay | Admitting: Dermatology

## 2024-04-08 ENCOUNTER — Other Ambulatory Visit: Payer: Self-pay | Admitting: Dermatology

## 2024-04-08 DIAGNOSIS — L709 Acne, unspecified: Secondary | ICD-10-CM

## 2024-09-14 ENCOUNTER — Ambulatory Visit: Admitting: Dermatology
